# Patient Record
Sex: Male | Born: 2000 | Race: White | Hispanic: No | Marital: Single | Smoking: Current some day smoker
Health system: Southern US, Community
[De-identification: ages and names within clinical notes are randomized; demographics above are authoritative.]

---
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
## 2023-07-03 ENCOUNTER — Encounter (HOSPITAL_COMMUNITY): Payer: Self-pay

## 2023-07-03 ENCOUNTER — Other Ambulatory Visit: Payer: Self-pay

## 2023-07-03 ENCOUNTER — Emergency Department (HOSPITAL_COMMUNITY)
Admission: EM | Admit: 2023-07-03 | Discharge: 2023-07-03 | Disposition: A | Discharge status: Home / Self Care | Payer: Self-pay | Attending: Emergency Medicine | Admitting: Emergency Medicine

## 2023-07-03 DIAGNOSIS — X58XXXA Exposure to other specified factors, initial encounter: Secondary | ICD-10-CM | POA: Insufficient documentation

## 2023-07-03 DIAGNOSIS — T1502XA Foreign body in cornea, left eye, initial encounter: Secondary | ICD-10-CM | POA: Insufficient documentation

## 2023-07-03 MED ORDER — NAPROXEN 500 MG PO TABS: 500.0000 mg | ORAL_TABLET | Freq: Two times a day (BID) | ORAL | 0 refills | Status: DC

## 2023-07-03 MED ORDER — ERYTHROMYCIN 5 MG/GM OP OINT: 1.0000 | TOPICAL_OINTMENT | Freq: Four times a day (QID) | OPHTHALMIC | 0 refills | Status: AC

## 2023-07-03 MED ORDER — ERYTHROMYCIN 5 MG/GM OP OINT
TOPICAL_OINTMENT | Freq: Once | OPHTHALMIC | Status: AC
  Administered 2023-07-03: 1 via OPHTHALMIC
  Filled 2023-07-03: qty 3.5

## 2023-07-03 MED ORDER — FLUORESCEIN SODIUM 1 MG OP STRP
ORAL_STRIP | OPHTHALMIC | Status: AC
  Filled 2023-07-03: qty 1

## 2023-07-03 MED ORDER — TETRACAINE HCL 0.5 % OP SOLN
1.0000 [drp] | Freq: Once | OPHTHALMIC | Status: AC
  Administered 2023-07-03: 1 [drp] via OPHTHALMIC
  Filled 2023-07-03: qty 4

## 2023-07-03 NOTE — ED Provider Notes (Signed)
 [name] AT [facility] Provider Note   CSN: [id number] Arrival date & time: 07/03/23  9153     Patient presents with: Eye Problem   Robert Mcdowell is a 23 y.o. male.  He denies significant PMH.  Presents the ER for left eye foreign body sensation.  States this happened last night, he was working on his truck and while taking some bolts off a lot of dust and dirt was coming down his face and something got in his eye.  He irrigated his eye and thinks he got some out but can still see a visible foreign body overlying his iris in the mirror today and still has foreign body sensation.  Denies blurry vision.  He does not wear contacts or glasses.  He reports his tetanus is up-to-date in the past 5 years    Eye Problem      Prior to Admission medications   Medication Sig Start Date End Date Taking? Authorizing Provider  erythromycin  ophthalmic ointment Place 1 Application into the right eye 4 (four) times daily for 5 days. 07/03/23 07/08/23 Yes Robert Mcdowell A, PA-C  naproxen (NAPROSYN) 500 MG tablet Take 1 tablet (500 mg total) by mouth 2 (two) times daily. 07/03/23  Yes Robert Mcdowell A, PA-C    Allergies: Patient has no known allergies.    Review of Systems  Updated Vital Signs BP (!) 134/97 (BP Location: Right Arm)   Pulse (!) 54   Temp 97.6 F (36.4 C) (Oral)   Resp 16   Ht 5' 11 (1.803 m)   Wt 65.8 kg   SpO2 98%   BMI 20.22 kg/m   Physical Exam Vitals and nursing note reviewed.  Constitutional:      General: He is not in acute distress.    Appearance: He is well-developed.  HENT:     Head: Normocephalic and atraumatic.     Mouth/Throat:     Mouth: Mucous membranes are moist.   Eyes:     General: Lids are normal. Vision grossly intact.        Left eye: Foreign body present.    Extraocular Movements: Extraocular movements intact.     Left eye: Normal extraocular motion.     Conjunctiva/sclera: Conjunctivae normal.     Left eye:  Left conjunctiva is not injected. No chemosis, exudate or hemorrhage.    Pupils: Pupils are equal, round, and reactive to light.     Left eye: Fluorescein  uptake present. Seidel exam negative.    Comments: Rust ring at approximately 5 o clock, not overlying visual axis   Cardiovascular:     Rate and Rhythm: Normal rate and regular rhythm.     Heart sounds: No murmur heard. Pulmonary:     Effort: Pulmonary effort is normal. No respiratory distress.     Breath sounds: Normal breath sounds.  Abdominal:     Palpations: Abdomen is soft.     Tenderness: There is no abdominal tenderness.   Musculoskeletal:        General: No swelling.     Cervical back: Neck supple.   Skin:    General: Skin is warm and dry.     Capillary Refill: Capillary refill takes less than 2 seconds.   Neurological:     General: No focal deficit present.     Mental Status: He is alert and oriented to person, place, and time.   Psychiatric:        Mood and Affect: Mood normal.     (  all labs ordered are listed, but only abnormal results are displayed) Labs Reviewed - No data to display  EKG: None  Radiology: No results found.   Procedures   Medications Ordered in the ED  fluorescein  1 MG ophthalmic strip (  Given by Other 07/03/23 1214)  tetracaine  (PONTOCAINE) 0.5 % ophthalmic solution 1 drop (1 drop Left Eye Given by Other 07/03/23 1215)  erythromycin  ophthalmic ointment (1 Application Left Eye Given 07/03/23 1255)                                    Medical Decision Making This patient presents to the ED for concern of R eye FB, this involves an extensive number of treatment options, and is a complaint that carries with it a high risk of complications and morbidity.  The differential diagnosis includes FB, abrasion, rust ring, conjunctivitis, other   Co morbidities that complicate the patient evaluation :   none     Consultations Obtained:  I requested consultation with the pulmonologist Dr.  Pecen,  and discussed lab and imaging findings as well as pertinent plan - they recommend: Erythromycin  ointment and follow-up in the office on Monday for removal of rust ring, erythromycin  ointment   Problem List / ED Course / Critical interventions / Medication management  Left eye foreign body sensation-patient had metallic foreign body removed easily with a cotton tip applicator but he has a residual rust ring and fortunately.  His pain resolved with topical tetracaine .  Discussed with ophthalmology as above who would like him to the office Monday morning for close follow-up appointment for removal of rust ring.  I discussed the importance of follow-up with patient, need for use of erythromycin  ointment and strict return precautions. I ordered medication including tetracaine   for eye pain  Reevaluation of the patient after these medicines showed that the patient improved I have reviewed the patients home medicines and have made adjustments as needed       Risk Prescription drug management.        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 07/03/23 1555    Robert Lamar BROCKS, MD 07/04/23 [phone]

## 2023-07-03 NOTE — ED Triage Notes (Signed)
 Pt was working on his truck last night, felt something get into his left eye. Left eye is red and irritated. Pt states he still feels like something is in there.

## 2023-07-03 NOTE — ED Notes (Signed)
 ED Provider at bedside.

## 2023-07-03 NOTE — Discharge Instructions (Addendum)
 It was a pleasure taking care of you today.  You are seen for a foreign body of your cornea.  We were able to remove this, but unfortunately you have a residual rust ring that will need to be removed by ophthalmology.  I spoke with Dr. Pecen today.  She wants you to use the antibiotic ointment 4 times a day and call the office on Monday morning so they can get you into the office to remove the rust ring.  If you have new or worsening symptoms in the meantime please come back to the ER right away.  Avoid rubbing or scratching your eye. In the future, please always use eye protection while working.

## 2023-10-31 ENCOUNTER — Emergency Department (HOSPITAL_COMMUNITY): Payer: Self-pay

## 2023-10-31 ENCOUNTER — Emergency Department (HOSPITAL_COMMUNITY)
Admission: EM | Admit: 2023-10-31 | Discharge: 2023-10-31 | Disposition: A | Discharge status: Home / Self Care | Payer: Self-pay | Attending: Emergency Medicine | Admitting: Emergency Medicine

## 2023-10-31 ENCOUNTER — Encounter (HOSPITAL_COMMUNITY): Payer: Self-pay | Admitting: *Deleted

## 2023-10-31 ENCOUNTER — Other Ambulatory Visit: Payer: Self-pay

## 2023-10-31 DIAGNOSIS — S8262XA Displaced fracture of lateral malleolus of left fibula, initial encounter for closed fracture: Secondary | ICD-10-CM | POA: Insufficient documentation

## 2023-10-31 DIAGNOSIS — W500XXA Accidental hit or strike by another person, initial encounter: Secondary | ICD-10-CM | POA: Insufficient documentation

## 2023-10-31 DIAGNOSIS — S82892A Other fracture of left lower leg, initial encounter for closed fracture: Secondary | ICD-10-CM

## 2023-10-31 MED ORDER — OXYCODONE-ACETAMINOPHEN 5-325 MG PO TABS
1.0000 | ORAL_TABLET | Freq: Once | ORAL | Status: AC
  Administered 2023-10-31: 1 via ORAL
  Filled 2023-10-31: qty 1

## 2023-10-31 MED ORDER — OXYCODONE-ACETAMINOPHEN 5-325 MG PO TABS: 1.0000 | ORAL_TABLET | ORAL | 0 refills | Status: AC | PRN

## 2023-10-31 MED ORDER — IBUPROFEN 800 MG PO TABS: 800.0000 mg | ORAL_TABLET | Freq: Three times a day (TID) | ORAL | 0 refills | Status: AC

## 2023-10-31 NOTE — Discharge Instructions (Signed)
 Elevate your foot when possible.  Use your crutches for walking or standing.  Please call the orthopedic provider listed to arrange follow-up appointment.

## 2023-10-31 NOTE — ED Provider Notes (Signed)
  EMERGENCY DEPARTMENT AT [facility] Provider Note   CSN: [id number] Arrival date & time: 10/31/23  1006     Patient presents with: Ankle Pain   Robert Mcdowell is a 23 y.o. male.  {Add pertinent medical, surgical, social history, OB history to HPI:32947}  Ankle Pain Associated symptoms: no fever        Patient complains of left ankle pain and swelling x 2 days.  States he was play fighting with a friend and he fell on his left ankle.  He has had pain and difficulty with standing and weightbearing since the incident occurred.  Denies any numbness of his foot or toes.  No open wounds.  Prior to Admission medications   Medication Sig Start Date End Date Taking? Authorizing Provider  naproxen  (NAPROSYN ) 500 MG tablet Take 1 tablet (500 mg total) by mouth 2 (two) times daily. 07/03/23   Suellen Cantor A, PA-C    Allergies: Patient has no known allergies.    Review of Systems  Constitutional:  Negative for appetite change, chills and fever.  Respiratory:  Negative for cough and shortness of breath.   Gastrointestinal:  Negative for nausea and vomiting.  Musculoskeletal:  Positive for arthralgias (left ankle pain) and joint swelling.  Skin:  Negative for color change and wound.  Neurological:  Negative for weakness and numbness.    Updated Vital Signs BP (!) 160/102 (BP Location: Right Arm)   Pulse 83   Temp 98.1 F (36.7 C)   Resp 17   Ht 5' 11 (1.803 m)   Wt 63.5 kg   SpO2 100%   BMI 19.53 kg/m   Physical Exam Vitals and nursing note reviewed.  Constitutional:      General: He is not in acute distress.    Appearance: Normal appearance. He is not toxic-appearing.  Cardiovascular:     Rate and Rhythm: Normal rate and regular rhythm.     Pulses: Normal pulses.  Pulmonary:     Effort: Pulmonary effort is normal.  Musculoskeletal:        General: Swelling, tenderness and signs of injury present. No deformity.     Right lower leg: No edema.      Left lower leg: No edema.     Left ankle: Tenderness present over the lateral malleolus. Decreased range of motion. Anterior drawer test negative. Normal pulse.     Left Achilles Tendon: Normal.  Skin:    General: Skin is warm.     Findings: Bruising present. No erythema.  Neurological:     Mental Status: He is alert.     (all labs ordered are listed, but only abnormal results are displayed) Labs Reviewed - No data to display  EKG: None  Radiology: DG Ankle Complete Left Result Date: 10/31/2023 CLINICAL DATA:  Status post trauma. EXAM: LEFT ANKLE COMPLETE - 3+ VIEW COMPARISON:  None Available. FINDINGS: An acute, nondisplaced fracture deformity is seen involving the left lateral malleolus. There is no evidence of dislocation. Mild to moderate severity lateral soft tissue swelling is noted. IMPRESSION: Acute fracture of the left lateral malleolus. Electronically Signed   By: Suzen Dials M.D.   On: 10/31/2023 11:33    {Document cardiac monitor, telemetry assessment procedure when appropriate:32947} Procedures   Medications Ordered in the ED  oxyCODONE-acetaminophen (PERCOCET/ROXICET) 5-325 MG per tablet 1 tablet (1 tablet Oral Given 10/31/23 1414)      {Click here for ABCD2, HEART and other calculators REFRESH Note before signing:1}  Medical Decision Making Patient here for evaluation of ankle injury that occurred 2 days ago.  Pain associated with weightbearing and with movement.  Some mild swelling.  Denies any numbness or weakness of his foot or toes.  No calf pain.  No open wounds.  Fracture, dislocation, sprain all considered in differential.  Compartments of the extremity are soft compartment syndrome felt less likely.  Amount and/or Complexity of Data Reviewed Radiology: ordered.    Details: X-ray shows nondisplaced fracture of the left lateral malleolus Discussion of management or test interpretation with external provider(s):    Discussed x-ray findings, patient placed in cam boot given crutches at his request.  Short course of pain medication and NSAID.  Database was reviewed.  Extremity neurovascularly intact.  He will follow-up closely outpatient with orthopedics.   Risk Prescription drug management.     {Document critical care time when appropriate  Document review of labs and clinical decision tools ie CHADS2VASC2, etc  Document your independent review of radiology images and any outside records  Document your discussion with family members, caretakers and with consultants  Document social determinants of health affecting pt's care  Document your decision making why or why not admission, treatments were needed:32947:::1}   Final diagnoses:  None    ED Discharge Orders     None

## 2023-10-31 NOTE — ED Triage Notes (Signed)
 Here by POV from home for L ankle pain/injury. Was play fighting with friend on Friday when he hurt it. Friend fell on ankle. Gradually progressively worse. Unable to bear weight. Denies previous injury to this ankle. No meds PTA. Swelling noted. No obvious deformity or bruising. CMS, skin intact. ROM limited. Compartments soft. Alert, NAD, calm, interactive, resps e/u, speaking in clear complete sentences.
